# Patient Record
Sex: Male | Born: 2000 | Race: Black or African American | Hispanic: No | Marital: Single | Smoking: Never smoker
Health system: Southern US, Community
[De-identification: ages and names within clinical notes are randomized; demographics above are authoritative.]

## PROBLEM LIST (undated history)

## (undated) DIAGNOSIS — A048 Other specified bacterial intestinal infections: Secondary | ICD-10-CM

## (undated) DIAGNOSIS — S83209A Unspecified tear of unspecified meniscus, current injury, unspecified knee, initial encounter: Secondary | ICD-10-CM

---
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
## 2020-05-11 ENCOUNTER — Emergency Department (HOSPITAL_COMMUNITY)
Admission: EM | Admit: 2020-05-11 | Discharge: 2020-05-11 | Disposition: A | Discharge status: Home / Self Care | Payer: Medicaid Other | Attending: Emergency Medicine | Admitting: Emergency Medicine

## 2020-05-11 ENCOUNTER — Other Ambulatory Visit: Payer: Self-pay

## 2020-05-11 ENCOUNTER — Encounter (HOSPITAL_COMMUNITY): Payer: Self-pay

## 2020-05-11 ENCOUNTER — Emergency Department (HOSPITAL_COMMUNITY): Payer: Medicaid Other

## 2020-05-11 DIAGNOSIS — M25561 Pain in right knee: Secondary | ICD-10-CM

## 2020-05-11 MED ORDER — IBUPROFEN 400 MG PO TABS: 400.0000 mg | ORAL_TABLET | Freq: Four times a day (QID) | ORAL | 0 refills | Status: DC | PRN

## 2020-05-11 MED ORDER — IBUPROFEN 200 MG PO TABS
600.0000 mg | ORAL_TABLET | Freq: Once | ORAL | Status: AC
  Administered 2020-05-11: 600 mg via ORAL
  Filled 2020-05-11: qty 3

## 2020-05-11 NOTE — ED Provider Notes (Signed)
Walter Lloyd Provider Note   CSN: [id number] Arrival date & time: 05/11/20  1205     History Chief Complaint  Patient presents with  . Knee Pain    Walter Lloyd is a 20 y.o. male.  HPI   Patient with no significant medical history presents with chief complaint of right knee pain and swelling.  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  He endorses that on Monday the pain got worse, and he noted some swelling, he denies any recent trauma to the area, denies increased activities, denies IV drug use, autoimmune diseases, never had this in the past.  He denies paresthesias or weakness in his lower extremities, states he has difficulty bearing weight on it as it causes pain to. He has been taking ibuprofen without relief.  Patient denies alleviating factors.  He denies penile or testicular pain or no discharge, denies rashes, other joint pain, throat pain.  Patient denies headaches, fevers, chills, shortness of breath, chest pain, abdominal pain, nausea, vomiting, diarrhea, worsening pedal edema.  History reviewed. No pertinent past medical history.  There are no problems to display for this patient.   History reviewed. No pertinent surgical history.     History reviewed. No pertinent family history.     Home Medications Prior to Admission medications   Medication Sig Start Date End Date Taking? Authorizing Provider  ibuprofen (ADVIL) 400 MG tablet Take 1 tablet (400 mg total) by mouth every 6 (six) hours as needed. 05/11/20  Yes Carroll Sage, PA-C    Allergies    Patient has no known allergies.  Review of Systems   Review of Systems  Constitutional: Negative for chills and fever.  HENT: Negative for congestion and sore throat.   Eyes: Negative for visual disturbance.  Respiratory: Negative for shortness of breath.    Cardiovascular: Negative for chest pain.  Gastrointestinal: Negative for abdominal pain, diarrhea, nausea and vomiting.  Genitourinary: Negative for decreased urine volume, difficulty urinating, enuresis, penile discharge, penile swelling and testicular pain.  Musculoskeletal: Negative for back pain.       Right knee pain and swelling.  Skin: Negative for rash.  Neurological: Negative for dizziness and headaches.  Hematological: Does not bruise/bleed easily.    Physical Exam Updated Vital Signs BP 135/63 (BP Location: Right Arm)   Pulse 93   Temp 98.4 F (36.9 C) (Oral)   Resp 18   SpO2 100%   Physical Exam Vitals and nursing note reviewed.  Constitutional:      General: He is not in acute distress.    Appearance: He is not ill-appearing.  HENT:     Head: Normocephalic and atraumatic.     Nose: No congestion.     Mouth/Throat:     Mouth: Mucous membranes are moist.     Pharynx: Oropharynx is clear. No oropharyngeal exudate.  Eyes:     Conjunctiva/sclera: Conjunctivae normal.     Pupils: Pupils are equal, round, and reactive to light.  Cardiovascular:     Rate and Rhythm: Normal rate and regular rhythm.     Pulses: Normal pulses.     Heart sounds: No murmur heard. No friction rub. No gallop.   Pulmonary:     Effort: No respiratory distress.     Breath sounds: No wheezing, rhonchi or rales.  Musculoskeletal:  General: Swelling and tenderness present. No signs of injury.     Right lower leg: No edema.     Left lower leg: No edema.     Comments: Patient's right knee is nonerythematous, edema present, it was tender to palpation on the medial aspect of his tibial plateau, there is no palpable deformities or crepitus present.  He had full range of motion his knee, ankle, toes.  Neurovascularly intact.  He had noted laxity with medial force with worsening pain, no laxity with posterior anterior forces.  Skin:    General: Skin is warm and dry.     Comments: Limited  skin exam was performed, there is no laceration, drainage, ecchymosis, petechia track marks noted on patient's upper or lower extremities.  Neurological:     Mental Status: He is alert.  Psychiatric:        Mood and Affect: Mood normal.     ED Results / Procedures / Treatments   Labs (all labs ordered are listed, but only abnormal results are displayed) Labs Reviewed - No data to display  EKG None  Radiology DG Knee Complete 4 Views Right  Result Date: 05/11/2020 CLINICAL DATA:  Chronic right knee pain.  No recent injury. EXAM: RIGHT KNEE - COMPLETE 4+ VIEW COMPARISON:  None. FINDINGS: No evidence of fracture, dislocation, or joint effusion. No evidence of arthropathy or other focal bone abnormality. Soft tissues are unremarkable. IMPRESSION: Negative exam. Electronically Signed   By: Drusilla Kanner M.D.   On: 05/11/2020 13:18    Procedures Procedures   Medications Ordered in ED Medications  ibuprofen (ADVIL) tablet 600 mg (600 mg Oral Given 05/11/20 1319)    ED Course  I have reviewed the triage vital signs and the nursing notes.  Pertinent labs & imaging results that were available during my care of the patient were reviewed by me and considered in my medical decision making (see chart for details).    MDM Rules/Calculators/A&P                          Initial impression-patient presents with right knee pain and swelling.  He is alert, does not appear in acute distress, vital signs reassuring.  Will obtain x-ray for further evaluation.  Suspect may have a ligament or meniscus damage.  Work-up-x-ray of knee is unremarkable.   Rule out- I have low suspicion for septic arthritis as patient denies IV drug use, skin exam was performed no erythematous, edematous, warm joints noted on exam, no new heart murmur heard on exam.  Low suspicion for fracture or dislocation as x-ray does not feel any significant findings. low suspicion for ligament or tendon damage as area was  palpated no gross defects noted, they had full range of motion as well as 5/5 strength.  Low suspicion for compartment syndrome as area was palpated it was soft to the touch, neurovascular fully intact.   Plan-I suspect patient suffering from possible ligament or meniscus injury.  Will provide patient with a brace and crutches.  Will recommend he follows up with Ortho for further evaluation.  Vital signs have remained stable, no indication for hospital admission.  Patient discussed with attending and they agreed with assessment and plan.  Patient given at home care as well strict return precautions.  Patient verbalized that they understood agreed to said plan.   Final Clinical Impression(s) / ED Diagnoses Final diagnoses:  Acute pain of right knee    Rx / DC  Orders ED Discharge Orders         Ordered    ibuprofen (ADVIL) 400 MG tablet  Every 6 hours PRN        05/11/20 1402           Carroll Sage, PA-C 05/11/20 1407    Charlynne Pander, MD 05/11/20 2218

## 2020-05-11 NOTE — Progress Notes (Signed)
Orthopedic Tech Progress Note Patient Details:  Walter Lloyd [facility] 01/14/2001 [id number]  Ortho Devices Ortho Device/Splint Location: knee sleeve to RLE and crutches Ortho Device/Splint Interventions: Ordered,Application,Adjustment   Post Interventions Patient Tolerated: Well Instructions Provided: Care of device   Jennye Moccasin 05/11/2020, 2:08 PM

## 2020-05-11 NOTE — Discharge Instructions (Addendum)
You have been seen here for right knee pain. I recommend taking over-the-counter pain medications like ibuprofen and/or Tylenol every 6 hours as needed.  Please follow dosage on the back of bottle.  I also recommend applying heat to the area and keeping it elevated while at rest will decrease inflammation.  I given you a brace please wear during the day you may take off at nighttime.  Want you to follow-up with orthopedics for further evaluation.   Come back to the emergency department if you develop chest pain, shortness of breath, severe abdominal pain, uncontrolled nausea, vomiting, diarrhea.

## 2020-05-11 NOTE — ED Triage Notes (Addendum)
Pt reports right knee pain since Monday, denies any injury.

## 2020-05-15 ENCOUNTER — Other Ambulatory Visit: Payer: Self-pay

## 2020-05-15 ENCOUNTER — Ambulatory Visit (INDEPENDENT_AMBULATORY_CARE_PROVIDER_SITE_OTHER): Payer: Medicaid Other | Admitting: Orthopaedic Surgery

## 2020-05-15 DIAGNOSIS — M659 Synovitis and tenosynovitis, unspecified: Secondary | ICD-10-CM | POA: Diagnosis not present

## 2020-05-15 MED ORDER — IBUPROFEN 800 MG PO TABS: ORAL_TABLET | ORAL | 0 refills | Status: DC

## 2020-05-15 MED ORDER — LIDOCAINE HCL 1 % IJ SOLN
0.5000 mL | INTRAMUSCULAR | Status: AC | PRN
  Administered 2020-05-15: .5 mL

## 2020-05-15 NOTE — Progress Notes (Signed)
Office Visit Note   Patient: Walter Lloyd           Date of Birth: 2000-06-10           MRN: [id number] Visit Date: 05/15/2020              Requested by: No referring provider defined for this encounter. PCP: Patient, No Pcp Per   Assessment & Plan: Visit Diagnoses:  1. Synovitis of right knee     Plan: 70 cc aspirated.  Fluid sent for Gram stain, cell count, crystals, cultures..  Recheck 1week.  Discussed with him that there is a slight possibility there could be infection present.  Patient has noted swelling going down with the ibuprofen which was 400 mg every 6 hours.  He is almost out of the ibuprofen will place him on 800 mg p.o. twice daily with food.  Follow-Up Instructions: No follow-ups on file.   Orders:  Orders Placed This Encounter  Procedures  . Large Joint Inj   Meds ordered this encounter  Medications  . ibuprofen (ADVIL) 800 MG tablet    Sig: Take one tablet twice daily with food as needed.    Dispense:  40 tablet    Refill:  0      Procedures: Large Joint Inj: R knee on 05/15/2020 9:41 AM Indications: pain and joint swelling Details: 22 G 1.5 in needle, anterolateral approach  Arthrogram: No  Medications: 0.5 mL lidocaine 1 % Aspirate: 70 mL yellow and cloudy Outcome: tolerated well, no immediate complications Procedure, treatment alternatives, risks and benefits explained, specific risks discussed. Consent was given by the patient. Immediately prior to procedure a time out was called to verify the correct patient, procedure, equipment, support staff and site/side marked as required. Patient was prepped and draped in the usual sterile fashion.       Clinical Data: No additional findings.   Subjective: Chief Complaint  Patient presents with  . Right Knee - Pain    HPI 20 year old male was doing squats with heavy weights on his shoulder and afterwards developed significant swelling in his knee with knee effusion pain difficulty  walking.  No fever chills no past history of knee problems other than occasionally after working out with weights he did notice some mild medial joint line pain.  Denies locking no history of ligamentous injury.  He is likes to run and also played some basketball in the past.  He is to ice his knee down along the medial aspect but states he did not ever have any problems with swelling until this acute episode.  He was seen in emergency room x-rays were negative for fracture.  Patient is a Consulting civil engineer at [employer], denies history of drug abuse.  Review of Systems all other systems are negative to HPI.   Objective: Vital Signs: BP (!) 135/58   Pulse 88   Ht 6\' 3"  (1.905 m)   Wt 187 lb (84.8 kg)   BMI 23.37 kg/m   Physical Exam Constitutional:      Appearance: He is well-developed and well-nourished.  HENT:     Head: Normocephalic and atraumatic.  Eyes:     Extraocular Movements: EOM normal.     Pupils: Pupils are equal, round, and reactive to light.  Neck:     Thyroid: No thyromegaly.     Trachea: No tracheal deviation.  Cardiovascular:     Rate and Rhythm: Normal rate.  Pulmonary:     Effort: Pulmonary effort is normal.  Breath sounds: No wheezing.  Abdominal:     General: Bowel sounds are normal.     Palpations: Abdomen is soft.  Skin:    General: Skin is warm and dry.     Capillary Refill: Capillary refill takes less than 2 seconds.  Neurological:     Mental Status: He is alert and oriented to person, place, and time.  Psychiatric:        Mood and Affect: Mood and affect normal.        Behavior: Behavior normal.        Thought Content: Thought content normal.        Judgment: Judgment normal.     Ortho Exam negative Lachman medial lateral collateral ligaments are stable.  Patient has a 3-4+ knee effusion.  No increased warmth.  He can flex to 90.  He does reach full extension.  He does have some medial joint line tenderness and tenderness over the medial plica.  Opposite left  knee shows minimal crepitus with knee extension.  Normal patellar tracking right left knee distal pulses are normal negative logroll to the hips.  Specialty Comments:  No specialty comments available.  Imaging: No results found.   PMFS History: Patient Active Problem List   Diagnosis Date Noted  . Synovitis of right knee 05/15/2020   No past medical history on file.  No family history on file.  No past surgical history on file. Social History   Occupational History  . Not on file  Tobacco Use  . Smoking status: Not on file  . Smokeless tobacco: Not on file  Substance and Sexual Activity  . Alcohol use: Not on file  . Drug use: Not on file  . Sexual activity: Not on file

## 2020-05-15 NOTE — Addendum Note (Signed)
Addended by: Rogers Seeds on: 05/15/2020 10:11 AM   Modules accepted: Orders

## 2020-05-21 LAB — ANAEROBIC AND AEROBIC CULTURE
AER RESULT:: NO GROWTH
MICRO NUMBER:: 11558572
MICRO NUMBER:: 11558573
SPECIMEN QUALITY:: ADEQUATE
SPECIMEN QUALITY:: ADEQUATE

## 2020-05-21 LAB — SYNOVIAL FLUID ANALYSIS, COMPLETE
Basophils, %: 0 %
Eosinophils-Synovial: 0 % (ref 0–2)
Lymphocytes-Synovial Fld: 39 % (ref 0–74)
Monocyte/Macrophage: 0 % (ref 0–69)
Neutrophil, Synovial: 59 % — ABNORMAL HIGH (ref 0–24)
Synoviocytes, %: 2 % (ref 0–15)
WBC, Synovial: 9185 cells/uL — ABNORMAL HIGH (ref <150)

## 2020-05-23 ENCOUNTER — Other Ambulatory Visit: Payer: Self-pay

## 2020-05-23 ENCOUNTER — Ambulatory Visit (INDEPENDENT_AMBULATORY_CARE_PROVIDER_SITE_OTHER): Payer: Medicaid Other | Admitting: Orthopaedic Surgery

## 2020-05-23 VITALS — BP 131/80 | HR 83 | Ht 75.0 in | Wt 187.0 lb

## 2020-05-23 DIAGNOSIS — M659 Synovitis and tenosynovitis, unspecified: Secondary | ICD-10-CM

## 2020-05-23 NOTE — Progress Notes (Signed)
Office Visit Note   Patient: Walter Lloyd           Date of Birth: [birth year]           MRN: [id number] Visit Date: 05/23/2020              Requested by: No referring provider defined for this encounter. PCP: Patient, No Pcp Per   Assessment & Plan: Visit Diagnoses:  1. Synovitis of right knee     Plan: Patient's had persistence of effusion of his knee fluid was tapped off no evidence of crystals no infection.  He has persistent pain and swelling difficulty walking pain with bending is had some catching but no true locking.  His pain is primarily medial and he may have a cartilage flap tear or medial meniscal tear which is persistently symptomatic despite anti-inflammatories intermittent ice, knee aspiration done 2/21 with 70cc yellow cloudy fluid aspirated.  Patient needs MRI scan office follow-up after scan for review.  Follow-Up Instructions: No follow-ups on file.   Orders:  Orders Placed This Encounter  Procedures  . MR Knee Right w/o contrast   No orders of the defined types were placed in this encounter.     Procedures: No procedures performed   Clinical Data: No additional findings.   Subjective: Chief Complaint  Patient presents with  . Right Knee - Follow-up    HPI Walter Lloyd is a 20 year old male with right knee pain that occurred few days after working out.  Fluid was removed negative for culture slightly cloudy no crystals.  Patient's been better still has some mild swelling.  70 cc was originally aspirated.  He is used ibuprofen.  Pain is primarily medial occasionally has had some catching no definite locking.  He does not recall any specific twisting injury but he was doing at one point squats with weights and other lower extremity jumping type strengthening activities along with upper extremity work.  He is to be active did a lot of running.  Review of Systems 14 point system negative no history of rheumatologic conditions.   Objective: Vital  Signs: BP 131/80   Pulse 83   Ht 6\' 3"  (1.905 m)   Wt 187 lb (84.8 kg)   BMI 23.37 kg/m   Physical Exam Constitutional:      Appearance: He is well-developed and well-nourished.  HENT:     Head: Normocephalic and atraumatic.  Eyes:     Extraocular Movements: EOM normal.     Pupils: Pupils are equal, round, and reactive to light.  Neck:     Thyroid: No thyromegaly.     Trachea: No tracheal deviation.  Cardiovascular:     Rate and Rhythm: Normal rate.  Pulmonary:     Effort: Pulmonary effort is normal.     Breath sounds: No wheezing.  Abdominal:     General: Bowel sounds are normal.     Palpations: Abdomen is soft.  Skin:    General: Skin is warm and dry.     Capillary Refill: Capillary refill takes less than 2 seconds.  Neurological:     Mental Status: He is alert and oriented to person, place, and time.  Psychiatric:        Mood and Affect: Mood and affect normal.        Behavior: Behavior normal.        Thought Content: Thought content normal.        Judgment: Judgment normal.     Ortho Exam 2+ knee  effusion no patellofemoral degenerative changes no subluxation.  Some tenderness along the medial joint line no palpable Baker's cyst posteriorly but some tenderness posterior medially.  Lateral joint lines nontender ACL PCL is normal.  Some posterior medial discomfort with hyperextension of the knee hip range of motion is normal reflexes are normal no sciatic notch tenderness.  No swelling or knee effusion of his opposite left knee  Specialty Comments:  No specialty comments available.  Imaging: No results found.   PMFS History: Patient Active Problem List   Diagnosis Date Noted  . Synovitis of right knee 05/15/2020   No past medical history on file.  No family history on file.  No past surgical history on file. Social History   Occupational History  . Not on file  Tobacco Use  . Smoking status: Not on file  . Smokeless tobacco: Not on file  Substance and  Sexual Activity  . Alcohol use: Not on file  . Drug use: Not on file  . Sexual activity: Not on file

## 2020-06-08 ENCOUNTER — Ambulatory Visit
Admission: RE | Admit: 2020-06-08 | Discharge: 2020-06-08 | Disposition: A | Discharge status: Home / Self Care | Payer: Medicaid Other | Source: Ambulatory Visit | Attending: Orthopaedic Surgery | Admitting: Orthopaedic Surgery

## 2020-06-08 ENCOUNTER — Other Ambulatory Visit: Payer: Self-pay

## 2020-06-08 DIAGNOSIS — M659 Synovitis and tenosynovitis, unspecified: Secondary | ICD-10-CM

## 2020-06-16 ENCOUNTER — Other Ambulatory Visit: Payer: Self-pay | Admitting: Orthopaedic Surgery

## 2020-06-30 ENCOUNTER — Ambulatory Visit (INDEPENDENT_AMBULATORY_CARE_PROVIDER_SITE_OTHER): Payer: Medicaid Other | Admitting: Orthopaedic Surgery

## 2020-06-30 VITALS — BP 142/77 | HR 71 | Ht 75.0 in | Wt 187.0 lb

## 2020-06-30 DIAGNOSIS — M659 Synovitis and tenosynovitis, unspecified: Secondary | ICD-10-CM | POA: Diagnosis not present

## 2020-06-30 NOTE — Progress Notes (Signed)
Office Visit Note   Patient: Walter Lloyd           Date of Birth: 03-05-2001           MRN: [id number] Visit Date: 06/30/2020              Requested by: No referring provider defined for this encounter. PCP: Patient, No Pcp Per (Inactive)   Assessment & Plan: Visit Diagnoses:  1. Synovitis of right knee     Plan: MRI images are reviewed pathophysiology discussed.  He will continue the ibuprofen and can gradually increase his activity as his symptoms improved.  We discussed at this point no knee arthroscopy needs to be done emergently.  MRI scan was reviewed with patient.  He is happy is made good improvement.  Although there is mild effusion today I do not think he needs repeat aspiration.  Follow-Up Instructions: Return in about 1 month (around 07/30/2020), or if symptoms worsen or fail to improve.   Orders:  No orders of the defined types were placed in this encounter.  No orders of the defined types were placed in this encounter.     Procedures: No procedures performed   Clinical Data: No additional findings.   Subjective: Chief Complaint  Patient presents with  . Right Knee - Pain, Follow-up    MRI review    HPI 20 year old male returns for follow-up of right knee problems with synovitis aspirated 70 cc yellow cloudy fluid negative for cultures and crystals.  He states as long as he takes ibuprofen it does not bother him and swelling is gradually going down.  When he wears a knee brace or sleeve tight on his knees noticed he gets some swelling in his ankle.  He denies fever chills no other joint symptoms.  Review of Systems unchanged from 05/23/2020 office visit.   Objective: Vital Signs: BP (!) 142/77   Pulse 71   Ht 6\' 3"  (1.905 m)   Wt 187 lb (84.8 kg)   BMI 23.37 kg/m   Physical Exam Constitutional:      Appearance: He is well-developed.  HENT:     Head: Normocephalic and atraumatic.  Eyes:     Pupils: Pupils are equal, round, and reactive to  light.  Neck:     Thyroid: No thyromegaly.     Trachea: No tracheal deviation.  Cardiovascular:     Rate and Rhythm: Normal rate.  Pulmonary:     Effort: Pulmonary effort is normal.     Breath sounds: No wheezing.  Abdominal:     General: Bowel sounds are normal.     Palpations: Abdomen is soft.  Skin:    General: Skin is warm and dry.     Capillary Refill: Capillary refill takes less than 2 seconds.  Neurological:     Mental Status: He is alert and oriented to person, place, and time.  Psychiatric:        Behavior: Behavior normal.        Thought Content: Thought content normal.        Judgment: Judgment normal.     Ortho Exam mild knee swelling good patellar tracking.  Trace joint line tenderness  Specialty Comments:  No specialty comments available.  Imaging: CLINICAL DATA:  Right knee pain. Sports injury February 2022.  EXAM: MRI OF THE RIGHT KNEE WITHOUT CONTRAST  TECHNIQUE: Multiplanar, multisequence MR imaging of the knee was performed. No intravenous contrast was administered.  COMPARISON:  None.  FINDINGS: MENISCI  Medial: Intact.  Lateral: Attenuation of the root of the posterior horn of the lateral meniscus concerning for a radial tear.  LIGAMENTS  Cruciates: ACL and PCL are intact.  Collaterals: Medial collateral ligament is intact. Iliotibial band and biceps femoris tendons are intact. Femoral insertion of the fibular collateral ligament is severely attenuated concerning for a ligament strain and a partial-thickness tear.  CARTILAGE  Patellofemoral:  No chondral defect.  Medial:  No chondral defect.  Lateral:  No chondral defect.  JOINT: Large joint effusion. Normal Hoffa's fat-pad. No plical thickening.  POPLITEAL FOSSA: Increased signal within the popliteus tendon at the femoral insertion concerning for severe tendinosis. Small Baker's cyst.  EXTENSOR MECHANISM: Intact quadriceps tendon. Intact patellar tendon.  Intact lateral patellar retinaculum. Intact medial patellar retinaculum. Intact MPFL.  BONES: No aggressive osseous lesion. No fracture or dislocation.  Other: No fluid collection or hematoma. Muscles are normal.  IMPRESSION: 1. Attenuation of the root of the posterior horn of the lateral meniscus concerning for a radial tear. 2. Increased signal within the popliteus tendon at the femoral insertion concerning for severe tendinosis. 3. Large joint effusion. 4. Femoral insertion of the fibular collateral ligament is severely attenuated concerning for a ligament strain and a partial-thickness tear.   Electronically Signed   By: Elige Ko   On: 06/09/2020 10:13   PMFS History: Patient Active Problem List   Diagnosis Date Noted  . Synovitis of right knee 05/15/2020   No past medical history on file.  No family history on file.  No past surgical history on file. Social History   Occupational History  . Not on file  Tobacco Use  . Smoking status: Not on file  . Smokeless tobacco: Not on file  Substance and Sexual Activity  . Alcohol use: Not on file  . Drug use: Not on file  . Sexual activity: Not on file

## 2020-07-16 ENCOUNTER — Other Ambulatory Visit: Payer: Self-pay | Admitting: Orthopaedic Surgery

## 2020-07-17 NOTE — Telephone Encounter (Signed)
Please advise 

## 2020-07-25 ENCOUNTER — Ambulatory Visit: Payer: Medicaid Other | Admitting: Orthopaedic Surgery

## 2021-05-12 ENCOUNTER — Other Ambulatory Visit: Payer: Self-pay

## 2021-05-12 ENCOUNTER — Encounter (HOSPITAL_COMMUNITY): Payer: Self-pay | Admitting: Emergency Medicine

## 2021-05-12 ENCOUNTER — Emergency Department (HOSPITAL_COMMUNITY)
Admission: EM | Admit: 2021-05-12 | Discharge: 2021-05-12 | Disposition: A | Discharge status: Home / Self Care | Payer: Medicaid Other | Attending: Emergency Medicine | Admitting: Emergency Medicine

## 2021-05-12 DIAGNOSIS — J039 Acute tonsillitis, unspecified: Secondary | ICD-10-CM

## 2021-05-12 DIAGNOSIS — Z20822 Contact with and (suspected) exposure to covid-19: Secondary | ICD-10-CM | POA: Diagnosis not present

## 2021-05-12 DIAGNOSIS — J029 Acute pharyngitis, unspecified: Secondary | ICD-10-CM | POA: Insufficient documentation

## 2021-05-12 LAB — GROUP A STREP BY PCR: Group A Strep by PCR: NOT DETECTED

## 2021-05-12 LAB — RESP PANEL BY RT-PCR (FLU A&B, COVID) ARPGX2
Influenza A by PCR: NEGATIVE
Influenza B by PCR: NEGATIVE
SARS Coronavirus 2 by RT PCR: NEGATIVE

## 2021-05-12 MED ORDER — DEXAMETHASONE SODIUM PHOSPHATE 10 MG/ML IJ SOLN
10.0000 mg | Freq: Once | INTRAMUSCULAR | Status: AC
  Administered 2021-05-12: 10 mg via INTRAMUSCULAR
  Filled 2021-05-12: qty 1

## 2021-05-12 NOTE — ED Triage Notes (Signed)
Pt reports sore throat and fever since Wednesday.

## 2021-05-12 NOTE — Discharge Instructions (Addendum)
You do not have strep, COVID or flu.  I suspect you have a viral tonsillitis, you were treated in the ED with a shot of steroids that should help reduce inflammation over the next 3 days.  Continue taking the naproxen and Tylenol.  Return to school on Monday if you are fever free and feeling improved, if not I have provided you a school note until Tuesday.  Return to ED for reevaluation if things worsen

## 2021-05-12 NOTE — ED Provider Triage Note (Signed)
Emergency Medicine Provider Triage Evaluation Note  Walter Lloyd , a 21 y.o. male  was evaluated in triage.  Pt complains of sore throat and neck pain x 4 days. Has tried nyquil without relief.   Review of Systems  Positive: Sore throat, neck pain Negative: Fevers, chills  Physical Exam  BP 129/78 (BP Location: Right Arm)    Pulse 83    Temp 98.2 F (36.8 C) (Oral)    Resp 18    SpO2 100%  Gen:   Awake, no distress   Resp:  Normal effort  MSK:   Moves extremities without difficulty  Other:  Normal passive ROM of neck  Medical Decision Making  Medically screening exam initiated at 2:55 PM.  Appropriate orders placed.  DRUE HARR was informed that the remainder of the evaluation will be completed by another provider, this initial triage assessment does not replace that evaluation, and the importance of remaining in the ED until their evaluation is complete.     Meshawn Oconnor T, PA-C 05/12/21 1504

## 2021-05-12 NOTE — ED Provider Notes (Signed)
[facility] EMERGENCY DEPARTMENT Provider Note   CSN: [id number] Arrival date & time: 05/12/21  1445     History  No chief complaint on file.   Walter Lloyd is a 21 y.o. male.  HPI  Patient presents with sore throat x4 days.  Associated with a headache and body aches and fevers at home.  He has been trying Tylenol and NyQuil, takes naproxen twice daily for knee pain that has not helped with the sore throat.  Denies any issues swallowing, no vomiting or abdominal pain.  No voice changes, no difficulty swallowing.  Home Medications Prior to Admission medications   Medication Sig Start Date End Date Taking? Authorizing Provider  ibuprofen (ADVIL) 800 MG tablet TAKE ONE TABLET TWICE DAILY WITH FOOD AS NEEDED. 07/17/20   Eldred Manges, MD      Allergies    Patient has no known allergies.    Review of Systems   Review of Systems  HENT:  Positive for sore throat.    Physical Exam Updated Vital Signs BP 129/78 (BP Location: Right Arm)    Pulse 83    Temp 98.2 F (36.8 C) (Oral)    Resp 18    SpO2 100%  Physical Exam Vitals and nursing note reviewed. Exam conducted with a chaperone present.  Constitutional:      General: He is not in acute distress.    Appearance: Normal appearance.  HENT:     Head: Normocephalic and atraumatic.     Mouth/Throat:     Mouth: Mucous membranes are moist.     Tongue: No lesions. Tongue does not deviate from midline.     Pharynx: Uvula midline. No oropharyngeal exudate or posterior oropharyngeal erythema.     Tonsils: No tonsillar exudate or tonsillar abscesses. 3+ on the right. 3+ on the left.  Eyes:     General: No scleral icterus.    Extraocular Movements: Extraocular movements intact.     Pupils: Pupils are equal, round, and reactive to light.  Cardiovascular:     Rate and Rhythm: Normal rate and regular rhythm.  Pulmonary:     Effort: Pulmonary effort is normal.     Breath sounds: Normal breath sounds.  Abdominal:      General: Abdomen is flat.     Tenderness: There is no abdominal tenderness.  Skin:    Coloration: Skin is not jaundiced.  Neurological:     Mental Status: He is alert. Mental status is at baseline.     Coordination: Coordination normal.    ED Results / Procedures / Treatments   Labs (all labs ordered are listed, but only abnormal results are displayed) Labs Reviewed  RESP PANEL BY RT-PCR (FLU A&B, COVID) ARPGX2  GROUP A STREP BY PCR    EKG None  Radiology No results found.  Procedures Procedures    Medications Ordered in ED Medications  dexamethasone (DECADRON) injection 10 mg (has no administration in time range)    ED Course/ Medical Decision Making/ A&P                           Medical Decision Making Risk Prescription drug management.   This patient presents to the ED for concern of sore throat, this involves an extensive number of treatment options, and is a complaint that carries with it a high risk of complications and morbidity.     The differential diagnosis includes viral vs. strep pharyngitis, PTA/retropharyngeal abscess,  Ludewig angina, sepsis other   Patient vital signs are normal without any fever, tachycardia, tachypnea.  Not septic appearing.  Lungs are clear to auscultation without stridor. Uvula is midline and there is no trismus on exam.  There is some tonsillar edema but no exudate.  Additionally, no submandibular swelling and sublingual space is soft.   I ordered IM Decadron for the sore throat and suspect tonsillitis.  Given exam and workup, I doubt PTA, retropharyngeal abscess, Ludwig angina or other emergent etiology for his pharyngitis.  Strep and COVID/flu PCR were ordered in triage, I independently viewed the results he is negative.    Given negative for strep pharyngitis and short duration of symptoms, suspect viral etiology and do not think antibiotics are indicated.    On reevaluation patient reports feeling improved. I  considered admission for period of observation, but  given he is overall well-appearing and tolerating p.o. I do not feel admission would be necessary.  Patient is stable for discharge.           Final Clinical Impression(s) / ED Diagnoses Final diagnoses:  None    Rx / DC Orders ED Discharge Orders     None         Theron Arista, Cordelia Poche 05/12/21 1744    Gloris Manchester, MD 05/12/21 2240

## 2022-03-04 IMAGING — MR MR KNEE*R* W/O CM
4 of 6 series · 22 of 40 positions shown · non-contrast
Comparison: None.

CLINICAL DATA: Right knee pain. Sports injury April 2020.

EXAM:
MRI OF THE RIGHT KNEE WITHOUT CONTRAST
TECHNIQUE: Multiplanar, multisequence MR imaging of the knee was performed. No
intravenous contrast was administered.

[Series 3: T2 fat-sat · axial · 4.0mm · 0.50mm/px · z∈[-69,+26]mm · 5 of 24 slices shown (1 of 2)]
[im 1/24]
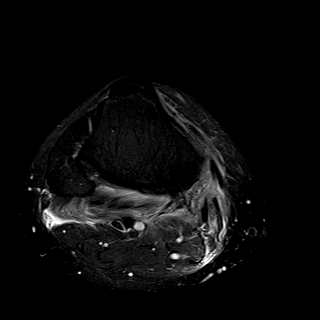
[im 4/24]
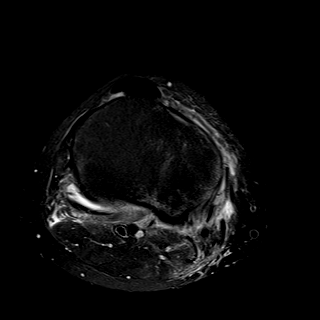
[im 8/24]
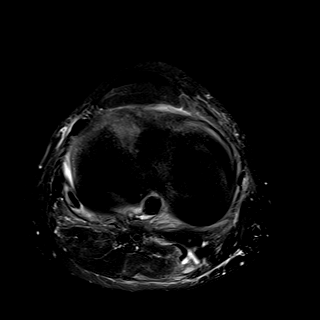
[im 12/24]
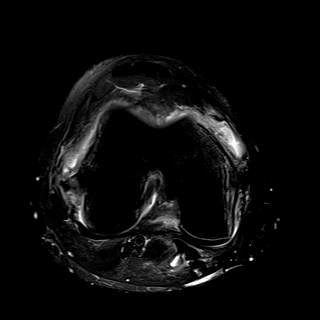
[im 20/24]
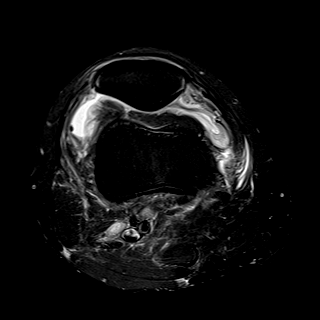

[Series 5: T2 fat-sat · coronal · 4.0mm · 0.29mm/px · 3 of 22 slices shown (2 of 2)]
[im 5/22]
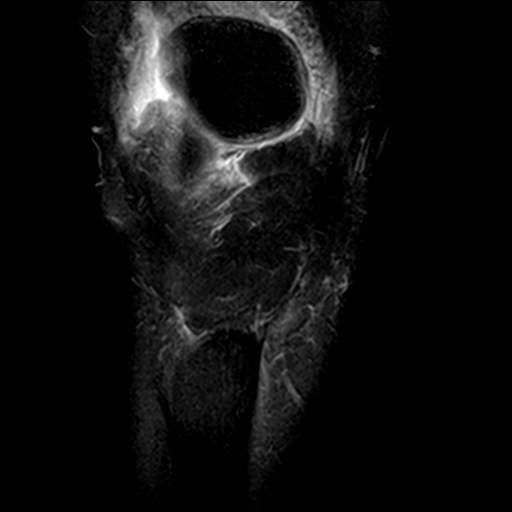
[im 13/22]
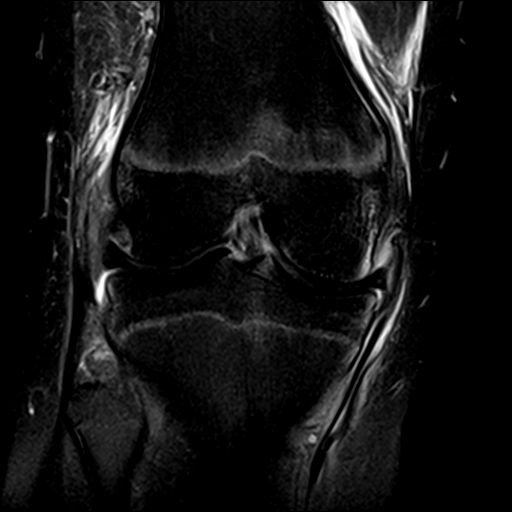
[im 22/22]
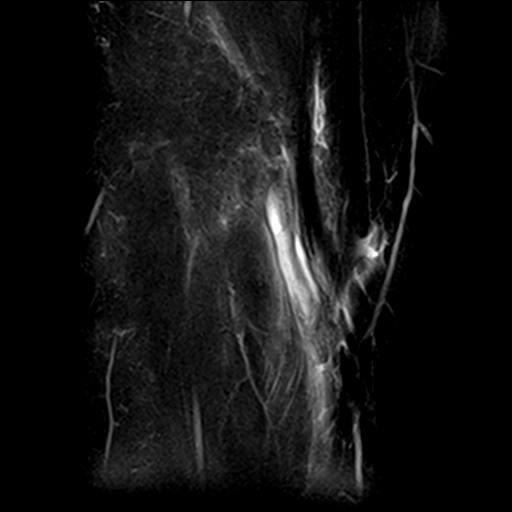

[Series 7: PD fat-sat · sagittal · 3.0mm · 0.29mm/px · 7 of 27 slices shown (1 of 2)]
[im 1/27]
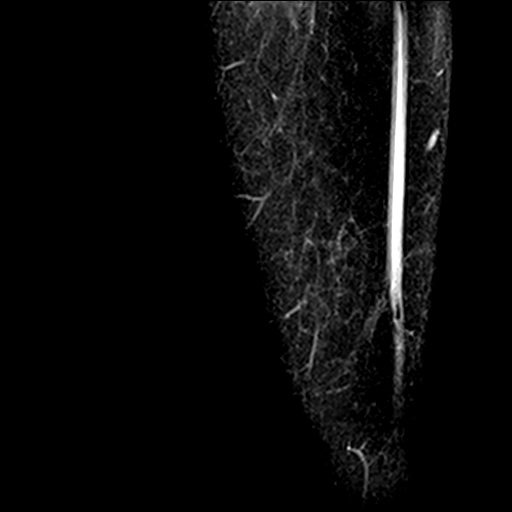
[im 5/27]
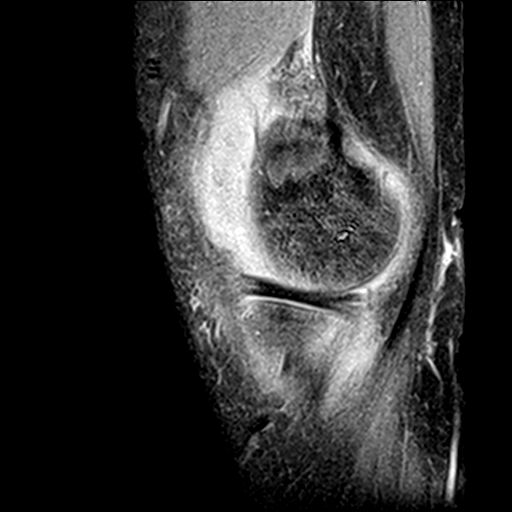
[im 9/27]
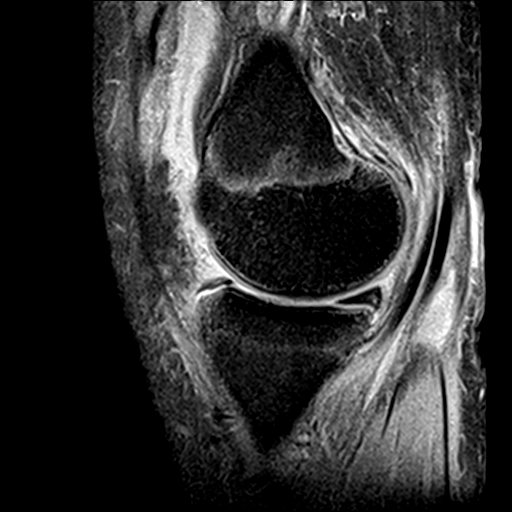
[im 14/27]
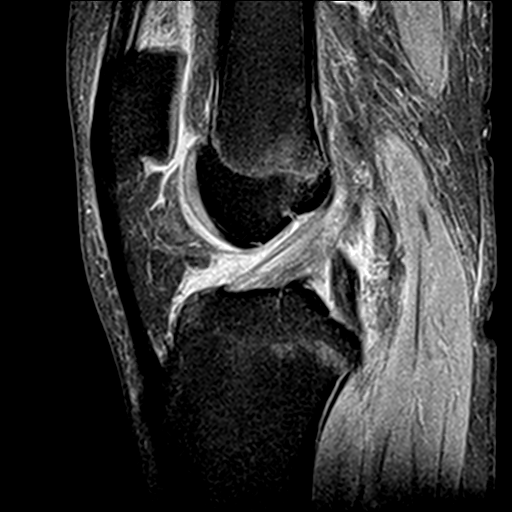
[im 18/27]
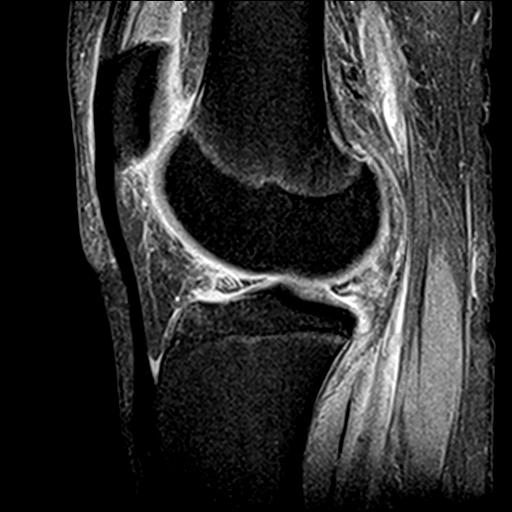
[im 22/27]
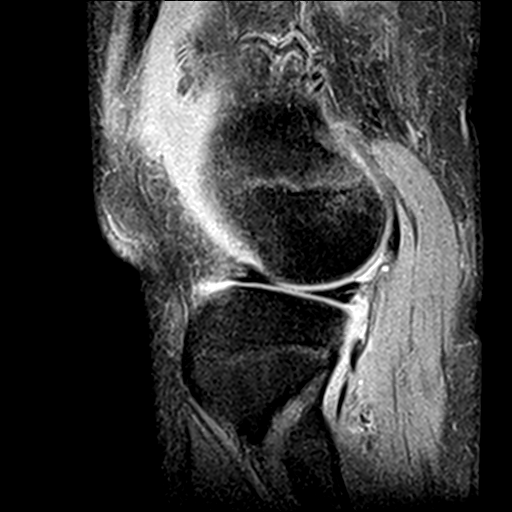
[im 27/27]
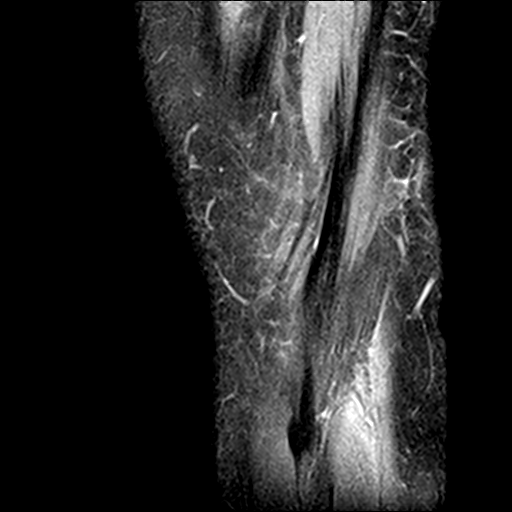

[Series 8: PD fat-sat · coronal · 3.0mm · 0.29mm/px · 7 of 28 slices shown (2 of 2)]
[im 1/28]
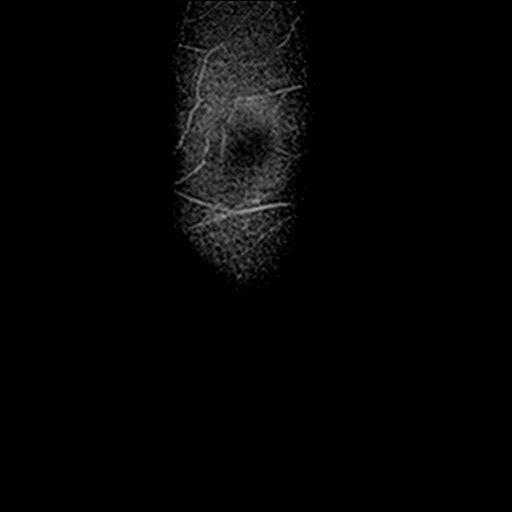
[im 5/28]
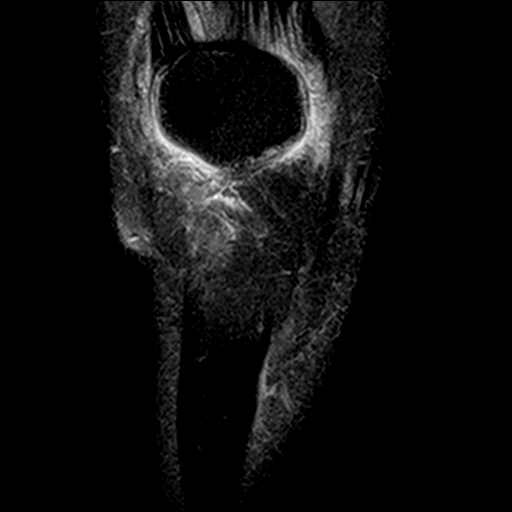
[im 10/28]
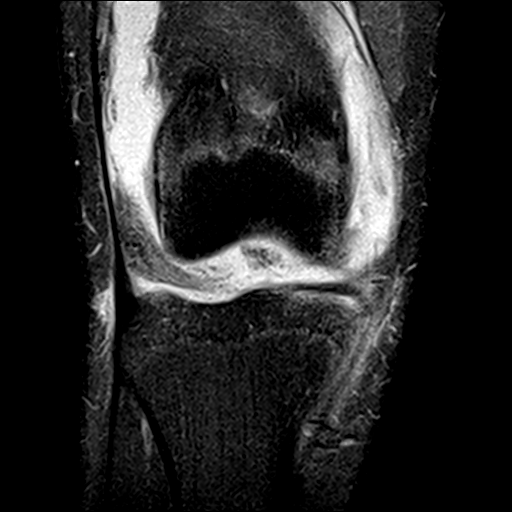
[im 14/28]
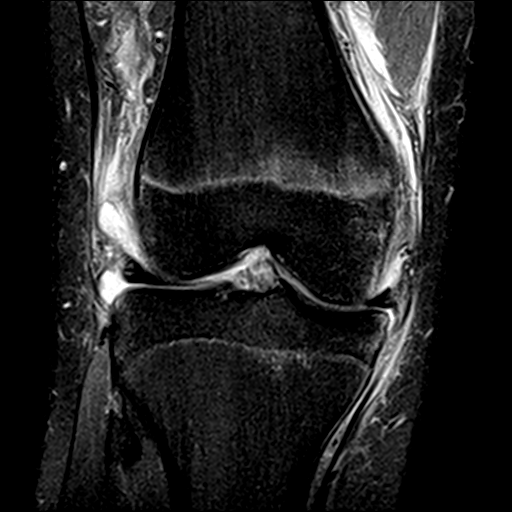
[im 19/28]
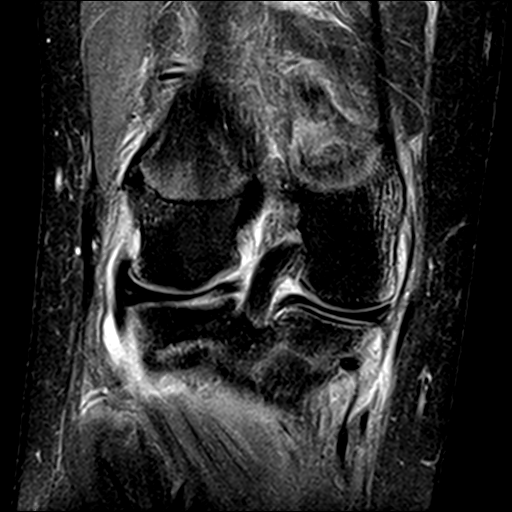
[im 23/28]
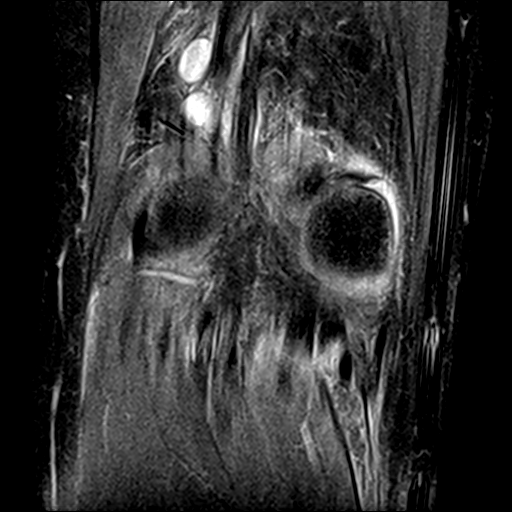
[im 28/28]
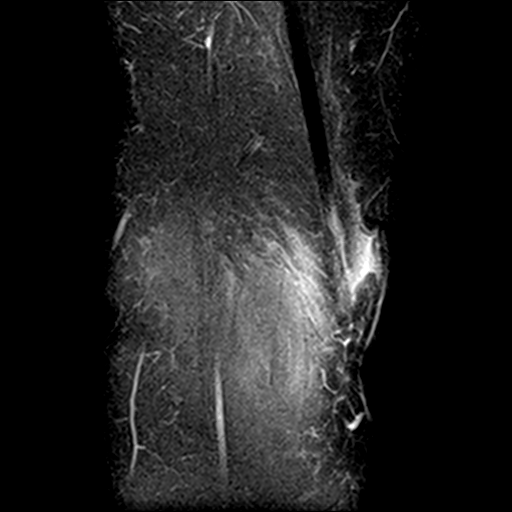

[22 of 40 positions shown; findings below may reference images not displayed]

FINDINGS: MENISCI

Medial: Intact.

Lateral: Attenuation of the root of the posterior horn of the
lateral meniscus concerning for a radial tear.

LIGAMENTS

Cruciates: ACL and PCL are intact.

Collaterals: Medial collateral ligament is intact. Iliotibial band
and biceps femoris tendons are intact. Femoral insertion of the
fibular collateral ligament is severely attenuated concerning for a
ligament strain and a partial-thickness tear.

CARTILAGE

Patellofemoral:  No chondral defect.

Medial:  No chondral defect.

Lateral:  No chondral defect.

JOINT: Large joint effusion. Normal Laru Rachid. No plical
thickening.

POPLITEAL FOSSA: Increased signal within the popliteus tendon at the
femoral insertion concerning for severe tendinosis. Small Baker's
cyst.

EXTENSOR MECHANISM: Intact quadriceps tendon. Intact patellar
tendon. Intact lateral patellar retinaculum. Intact medial patellar
retinaculum. Intact MPFL.

BONES: No aggressive osseous lesion. No fracture or dislocation.

Other: No fluid collection or hematoma. Muscles are normal.
IMPRESSION: 1. Attenuation of the root of the posterior horn of the lateral
meniscus concerning for a radial tear.
2. Increased signal within the popliteus tendon at the femoral
insertion concerning for severe tendinosis.
3. Large joint effusion.
4. Femoral insertion of the fibular collateral ligament is severely
attenuated concerning for a ligament strain and a partial-thickness
tear.

## 2022-06-19 ENCOUNTER — Encounter (HOSPITAL_COMMUNITY): Payer: Self-pay

## 2022-06-19 ENCOUNTER — Emergency Department (HOSPITAL_COMMUNITY)
Admission: EM | Admit: 2022-06-19 | Discharge: 2022-06-19 | Disposition: A | Discharge status: Home / Self Care | Payer: Medicaid Other | Attending: Emergency Medicine | Admitting: Emergency Medicine

## 2022-06-19 DIAGNOSIS — M25552 Pain in left hip: Secondary | ICD-10-CM

## 2022-06-19 HISTORY — DX: Other specified bacterial intestinal infections: A04.8

## 2022-06-19 HISTORY — DX: Unspecified tear of unspecified meniscus, current injury, unspecified knee, initial encounter: S83.209A

## 2022-06-19 MED ORDER — IBUPROFEN 800 MG PO TABS
800.0000 mg | ORAL_TABLET | Freq: Once | ORAL | Status: AC
  Administered 2022-06-19: 800 mg via ORAL
  Filled 2022-06-19: qty 1

## 2022-06-19 MED ORDER — IBUPROFEN 800 MG PO TABS: 800.0000 mg | ORAL_TABLET | Freq: Three times a day (TID) | ORAL | 0 refills | Status: AC

## 2022-06-19 NOTE — ED Triage Notes (Signed)
Pt reports left hip pain, worse with movement, no injuries or falls. Pt reports no pain with palpitation, denies radiation to LLE, hurts to ambulate, pt has full ROM to LLE. Denies pain to groin area, denies urinary symptoms. NAD noted, VSS.

## 2022-06-19 NOTE — ED Provider Notes (Signed)
[place] EMERGENCY DEPARTMENT AT [facility] Provider Note   CSN: [id number] Arrival date & time: 06/19/22  2139     History  Chief Complaint  Patient presents with   Hip Pain    Walter Lloyd is a 22 y.o. male.  With no significant past medical history who presents to the ED for evaluation of atraumatic left hip pain.  He states it began yesterday and has progressively gotten worse.  He noticed it when he woke up yesterday morning.  He denies falls or trauma.  States the pain is mostly present when he is walking.  He denies recent illnesses, numbness, weakness, tingling, low back pain, fevers, chills.  Pain does not radiate down the leg.  It is localized to the lateral aspect overlying the greater trochanter.   Hip Pain       Home Medications Prior to Admission medications   Medication Sig Start Date End Date Taking? Authorizing Provider  ibuprofen (ADVIL) 800 MG tablet Take 1 tablet (800 mg total) by mouth 3 (three) times daily. 06/19/22  Yes Brandyn Thien, Grafton Folk, PA-C      Allergies    Patient has no known allergies.    Review of Systems   Review of Systems  Musculoskeletal:  Positive for arthralgias.  All other systems reviewed and are negative.   Physical Exam Updated Vital Signs BP (!) 155/73 (BP Location: Left Arm)   Pulse 83   Temp 98.1 F (36.7 C) (Oral)   Resp 18   Ht 6\' 3"  (1.905 m)   Wt 95.3 kg   SpO2 100%   BMI 26.25 kg/m  Physical Exam Vitals and nursing note reviewed.  Constitutional:      General: He is not in acute distress.    Appearance: Normal appearance. He is normal weight. He is not ill-appearing.  HENT:     Head: Normocephalic and atraumatic.  Pulmonary:     Effort: Pulmonary effort is normal. No respiratory distress.  Abdominal:     General: Abdomen is flat.  Musculoskeletal:        General: Normal range of motion.     Cervical back: Neck supple.     Comments: No TTP to left hip.  Ambulated without difficulty.   Full active ROM including flexion and extension of the left hip.  Skin:    General: Skin is warm and dry.     Findings: No rash.  Neurological:     Mental Status: He is alert and oriented to person, place, and time.  Psychiatric:        Mood and Affect: Mood normal.        Behavior: Behavior normal.     ED Results / Procedures / Treatments   Labs (all labs ordered are listed, but only abnormal results are displayed) Labs Reviewed - No data to display  EKG None  Radiology No results found.  Procedures Procedures    Medications Ordered in ED Medications  ibuprofen (ADVIL) tablet 800 mg (800 mg Oral Given 06/19/22 2219)    ED Course/ Medical Decision Making/ A&P                             Medical Decision Making Risk Prescription drug management.  This patient presents to the ED for concern of left hip pain, this involves an extensive number of treatment options.  The differential diagnosis includes strain, sprain, contusion, bursitis  My initial workup includes  pain control  Additional history obtained from: Nursing notes from this visit.  Afebrile, hemodynamically stable.  22 year old male presenting to the ED for evaluation of atraumatic left hip pain.  His physical exam is reassuring and benign.  In the absence of trauma, and after shared decision-making conversation patient opted out of imaging.  He was given Motrin in the ED.  Pain likely secondary to muscle strain.  He was encouraged to follow-up with his primary care provider if his symptoms do not work.  He was educated on appropriate dosing of Tylenol and ibuprofen at home.  He was educated to perform gentle daily range of motion stretching exercises.  He was given return precautions.  Stable at discharge.  At this time there does not appear to be any evidence of an acute emergency medical condition and the patient appears stable for discharge with appropriate outpatient follow up. Diagnosis was discussed with  patient who verbalizes understanding of care plan and is agreeable to discharge. I have discussed return precautions with patient who verbalizes understanding. Patient encouraged to follow-up with their PCP within 1 week. All questions answered.  Note: Portions of this report may have been transcribed using voice recognition software. Every effort was made to ensure accuracy; however, inadvertent computerized transcription errors may still be present.        Final Clinical Impression(s) / ED Diagnoses Final diagnoses:  Left hip pain    Rx / DC Orders ED Discharge Orders          Ordered    ibuprofen (ADVIL) 800 MG tablet  3 times daily        06/19/22 2215              Nehemiah Massed 06/19/22 2307    Hayden Rasmussen, MD 06/20/22 1052

## 2022-06-19 NOTE — Discharge Instructions (Signed)
You have been seen today for your complaint of left hip pain. Your discharge medications include Alternate tylenol and ibuprofen for pain. You may alternate these every 4 hours. You may take up to 800 mg of ibuprofen at a time and up to 1000 mg of tylenol. Home care instructions are as follows:  Perform gentle daily stretching exercises Follow up with: A primary care provider in 1 week for reevaluation Please seek immediate medical care if you develop any of the following symptoms: You cannot move the joint. Your fingers or toes tingle, become numb, or turn cold and blue. You have a fever along with a joint that is red, warm, and swollen. At this time there does not appear to be the presence of an emergent medical condition, however there is always the potential for conditions to change. Please read and follow the below instructions.  Do not take your medicine if  develop an itchy rash, swelling in your mouth or lips, or difficulty breathing; call 911 and seek immediate emergency medical attention if this occurs.  You may review your lab tests and imaging results in their entirety on your MyChart account.  Please discuss all results of fully with your primary care provider and other specialist at your follow-up visit.  Note: Portions of this text may have been transcribed using voice recognition software. Every effort was made to ensure accuracy; however, inadvertent computerized transcription errors may still be present.

## 2022-07-15 ENCOUNTER — Emergency Department (HOSPITAL_COMMUNITY): Payer: Medicaid Other

## 2022-07-15 ENCOUNTER — Emergency Department (HOSPITAL_COMMUNITY)
Admission: EM | Admit: 2022-07-15 | Discharge: 2022-07-15 | Disposition: A | Discharge status: Home / Self Care | Payer: Medicaid Other | Attending: Emergency Medicine | Admitting: Emergency Medicine

## 2022-07-15 ENCOUNTER — Other Ambulatory Visit: Payer: Self-pay

## 2022-07-15 ENCOUNTER — Encounter (HOSPITAL_COMMUNITY): Payer: Self-pay

## 2022-07-15 DIAGNOSIS — R1031 Right lower quadrant pain: Secondary | ICD-10-CM | POA: Diagnosis present

## 2022-07-15 DIAGNOSIS — K5900 Constipation, unspecified: Secondary | ICD-10-CM | POA: Diagnosis not present

## 2022-07-15 LAB — URINALYSIS, ROUTINE W REFLEX MICROSCOPIC
Bilirubin Urine: NEGATIVE
Glucose, UA: NEGATIVE mg/dL
Hgb urine dipstick: NEGATIVE
Ketones, ur: NEGATIVE mg/dL
Leukocytes,Ua: NEGATIVE
Nitrite: NEGATIVE
Protein, ur: NEGATIVE mg/dL
Specific Gravity, Urine: 1.018 (ref 1.005–1.030)
pH: 5 (ref 5.0–8.0)

## 2022-07-15 LAB — CBC WITH DIFFERENTIAL/PLATELET
Abs Immature Granulocytes: 0.02 10*3/uL (ref 0.00–0.07)
Basophils Absolute: 0 10*3/uL (ref 0.0–0.1)
Basophils Relative: 0 %
Eosinophils Absolute: 0.2 10*3/uL (ref 0.0–0.5)
Eosinophils Relative: 3 %
HCT: 45 % (ref 39.0–52.0)
Hemoglobin: 14.5 g/dL (ref 13.0–17.0)
Immature Granulocytes: 0 %
Lymphocytes Relative: 37 %
Lymphs Abs: 2.6 10*3/uL (ref 0.7–4.0)
MCH: 27.4 pg (ref 26.0–34.0)
MCHC: 32.2 g/dL (ref 30.0–36.0)
MCV: 85.1 fL (ref 80.0–100.0)
Monocytes Absolute: 0.7 10*3/uL (ref 0.1–1.0)
Monocytes Relative: 9 %
Neutro Abs: 3.5 10*3/uL (ref 1.7–7.7)
Neutrophils Relative %: 51 %
Platelets: 321 10*3/uL (ref 150–400)
RBC: 5.29 MIL/uL (ref 4.22–5.81)
RDW: 12.9 % (ref 11.5–15.5)
WBC: 7 10*3/uL (ref 4.0–10.5)
nRBC: 0 % (ref 0.0–0.2)

## 2022-07-15 LAB — COMPREHENSIVE METABOLIC PANEL
ALT: 20 U/L (ref 0–44)
AST: 36 U/L (ref 15–41)
Albumin: 4.4 g/dL (ref 3.5–5.0)
Alkaline Phosphatase: 56 U/L (ref 38–126)
Anion gap: 9 (ref 5–15)
BUN: 14 mg/dL (ref 6–20)
CO2: 28 mmol/L (ref 22–32)
Calcium: 9.4 mg/dL (ref 8.9–10.3)
Chloride: 100 mmol/L (ref 98–111)
Creatinine, Ser: 1.2 mg/dL (ref 0.61–1.24)
GFR, Estimated: >60 mL/min (ref >60)
Glucose, Bld: 97 mg/dL (ref 70–99)
Potassium: 3.8 mmol/L (ref 3.5–5.1)
Sodium: 137 mmol/L (ref 135–145)
Total Bilirubin: 0.5 mg/dL (ref 0.3–1.2)
Total Protein: 8.2 g/dL — ABNORMAL HIGH (ref 6.5–8.1)

## 2022-07-15 LAB — LIPASE, BLOOD: Lipase: 32 U/L (ref 11–51)

## 2022-07-15 MED ORDER — POLYETHYLENE GLYCOL 3350 17 GM/SCOOP PO POWD: 1.0000 | Freq: Once | ORAL | 0 refills | Status: AC

## 2022-07-15 NOTE — ED Triage Notes (Signed)
Patient began having right lower quadrant abdominal pain 30 minutes ago after he was bending down to pray. Feels like a cramp. No nausea or vomiting.

## 2022-07-15 NOTE — Discharge Instructions (Addendum)
You were seen in the emergency department for abdominal cramping. Based on labs and imaging, there appears to be a large stool burden in your bowels indicating constipation. I would encouraged increasing water and fiber intake as well as adding in miralax to your diet to promote healthy bowel movements. Please return to the ED if your symptoms are worsening.

## 2022-07-15 NOTE — ED Provider Triage Note (Signed)
Emergency Medicine Provider Triage Evaluation Note  Walter Lloyd , a 22 y.o. male  was evaluated in triage.  Pt complains of abdominal pain.  Reports had a brief episode of right lower quadrant abdominal pain when he bent down earlier today to pray.  He did report that he was at the gym exercising earlier but did not have any pain in his abdomen at that time.  Denies any dysuria hematuria, increased urinary frequency or urgency.  Denies any nausea or vomiting with this.  No history of any abdominal surgeries but did report history of H. pylori.  Not currently on any medications.  Denies constipation or diarrhea recently.  Review of Systems  Positive: As above Negative: As above  Physical Exam  BP (!) 147/86 (BP Location: Left Arm)   Pulse 90   Temp 97.8 F (36.6 C) (Oral)   Resp 16   Ht  (1.905 m)   Wt 95.3 kg   SpO2 100%   BMI 26.25 kg/m  Gen:   Awake, no distress   Resp:  Normal effort  MSK:   Moves extremities without difficulty  Other:  Tenderness to palpation right lower quadrant/periumbilical  Medical Decision Making  Medically screening exam initiated at 6:10 PM.  Appropriate orders placed.  THEOPHIL THIVIERGE was informed that the remainder of the evaluation will be completed by another provider, this initial triage assessment does not replace that evaluation, and the importance of remaining in the ED until their evaluation is complete.     Smitty Knudsen, PA-C 07/15/22 1812

## 2022-07-24 NOTE — ED Provider Notes (Signed)
[place] EMERGENCY DEPARTMENT AT [facility] Provider Note   CSN: [id number] Arrival date & time: 07/15/22  1744     History Chief Complaint  Patient presents with   Abdominal Cramping    Walter Lloyd is a 22 y.o. male.  Patient presents emergency department complaints of right lower quadrant abdominal pain present for about 30 minutes prior to arriving in the emergency department.  He reports that he was kneeling down to play with this abdominal pain began.  No prior history of any abdominal abnormalities.  Feels that his abdomen is cramping.  Denies any nausea or vomiting.  Denies any urinary symptoms at the time such as dysuria, hematuria, increased urinary frequency or urgency.   Abdominal Cramping Associated symptoms include abdominal pain.       Home Medications Prior to Admission medications   Medication Sig Start Date End Date Taking? Authorizing Provider  ibuprofen (ADVIL) 800 MG tablet Take 1 tablet (800 mg total) by mouth 3 (three) times daily. 06/19/22   Schutt, Edsel Petrin, PA-C      Allergies    Patient has no known allergies.    Review of Systems   Review of Systems  Gastrointestinal:  Positive for abdominal pain.  All other systems reviewed and are negative.   Physical Exam Updated Vital Signs BP (!) 142/80 (BP Location: Right Arm)   Pulse 88   Temp 98.2 F (36.8 C) (Oral)   Resp 16   Ht 6\' 3"  (1.905 m)   Wt 95.3 kg   SpO2 99%   BMI 26.25 kg/m  Physical Exam Vitals and nursing note reviewed.  Constitutional:      General: He is not in acute distress.    Appearance: He is well-developed.  HENT:     Head: Normocephalic and atraumatic.  Eyes:     Conjunctiva/sclera: Conjunctivae normal.  Cardiovascular:     Rate and Rhythm: Normal rate and regular rhythm.     Heart sounds: No murmur heard. Pulmonary:     Effort: Pulmonary effort is normal. No respiratory distress.     Breath sounds: Normal breath sounds.  Abdominal:      Palpations: Abdomen is soft.     Tenderness: There is abdominal tenderness.  Musculoskeletal:        General: No swelling.     Cervical back: Neck supple.  Skin:    General: Skin is warm and dry.     Capillary Refill: Capillary refill takes less than 2 seconds.  Neurological:     Mental Status: He is alert.  Psychiatric:        Mood and Affect: Mood normal.     ED Results / Procedures / Treatments   Labs (all labs ordered are listed, but only abnormal results are displayed) Labs Reviewed  COMPREHENSIVE METABOLIC PANEL - Abnormal; Notable for the following components:      Result Value   Total Protein 8.2 (*)    All other components within normal limits  CBC WITH DIFFERENTIAL/PLATELET  LIPASE, BLOOD  URINALYSIS, ROUTINE W REFLEX MICROSCOPIC    EKG None  Radiology No results found.  Procedures Procedures   Medications Ordered in ED Medications - No data to display  ED Course/ Medical Decision Making/ A&P Clinical Course as of 07/24/22 0047  Mon Jul 15, 2022  2121 CT ABDOMEN PELVIS WO CONTRAST Large stool burden. No other specific pathology to explain pain [OZ]    Clinical Course User Index [OZ] Smitty Knudsen, PA-C  Medical Decision Making Amount and/or Complexity of Data Reviewed Labs: ordered. Radiology: ordered. Decision-making details documented in ED Course.   This patient presents to the ED for concern of abdominal pain.  Differential diagnosis includes appendicitis, cholecystitis, diverticulitis, bowel obstruction, constipation   Lab Tests:  I Ordered, and personally interpreted labs.  The pertinent results include: CBC, CMP, and lipase unremarkable, urine negative for infection   Imaging Studies ordered:  I ordered imaging studies including CT abdomen pelvis I independently visualized and interpreted imaging which showed large stool burden but no other acute abnormalities I agree with the radiologist  interpretation  Problem List / ED Course:  Patient presents to the emergency department complaints of abdominal cramping.  He reports that this Began when he was kneeling down to pray.  Denies any prior history of abdominal abnormalities.  No recent abdominal surgeries.  Based on patient's presentation, lab workup initiated which was thankfully reassuring without any acute abnormalities noted on CBC, CMP, lipase, urine.  CT abdomen pelvis also ordered which thankfully is reassuring as there is evidence of a large stool burden but no signs of any bowel obstruction, appendicitis, cholecystitis.  Advised patient he likely needs to be on an osmotic laxative such as MiraLAX and fiber to increase the quality of his bowel movements to reduce the discomfort has been feeling in his abdomen.  Prescription for MiraLAX and the patient's pharmacy with instructions to take this medication to reduce the abdominal discomfort has been feeling.  Also have provided patient with information for [facility] to establish care with a primary care provider.  Patient is agreeable with this treatment plan and verbalized understanding all return precautions.  All questions answered prior to patient discharge.  Final Clinical Impression(s) / ED Diagnoses Final diagnoses:  Constipation, unspecified constipation type    Rx / DC Orders ED Discharge Orders          Ordered    polyethylene glycol powder (GLYCOLAX/MIRALAX) 17 GM/SCOOP powder   Once        07/15/22 2158              Smitty Knudsen, PA-C 07/24/22 0047    Sloan Leiter, DO 07/25/22 0117

## 2022-08-31 ENCOUNTER — Emergency Department (HOSPITAL_COMMUNITY)
Admission: EM | Admit: 2022-08-31 | Discharge: 2022-08-31 | Disposition: A | Discharge status: Home / Self Care | Payer: Medicaid Other | Attending: Emergency Medicine | Admitting: Emergency Medicine

## 2022-08-31 ENCOUNTER — Other Ambulatory Visit: Payer: Self-pay

## 2022-08-31 DIAGNOSIS — M25462 Effusion, left knee: Secondary | ICD-10-CM | POA: Insufficient documentation

## 2022-08-31 DIAGNOSIS — M25469 Effusion, unspecified knee: Secondary | ICD-10-CM

## 2022-08-31 NOTE — ED Provider Notes (Signed)
[place] EMERGENCY DEPARTMENT AT [facility] Provider Note   CSN: [id number] Arrival date & time: 08/31/22  4098     History  Chief Complaint  Patient presents with   knee stiffness    Walter Lloyd is a 22 y.o. male.  Patient with history of right meniscus tear 2 years ago that was not repaired surgically presents today with complaints of left knee swelling. He states that he did a leg workout on Wednesday and woke up Thursday morning with swelling. He states that his knee is always swollen since he tore his meniscus but today is more than usual. He denies any pain. Does state that he feels like he cannot range his knee as well as normal due to it feeling stiff. He is able to walk without issue. Denies fevers or chills. States that this has happened before and he has not been seen for it.   The history is provided by the patient. No language interpreter was used.       Home Medications Prior to Admission medications   Medication Sig Start Date End Date Taking? Authorizing Provider  ibuprofen (ADVIL) 800 MG tablet Take 1 tablet (800 mg total) by mouth 3 (three) times daily. 06/19/22   Schutt, Edsel Petrin, PA-C      Allergies    Patient has no known allergies.    Review of Systems   Review of Systems  Musculoskeletal:  Positive for joint swelling.  All other systems reviewed and are negative.   Physical Exam Updated Vital Signs BP 101/83 (BP Location: Right Arm)   Pulse 65   Temp 98.5 F (36.9 C) (Oral)   Resp 16   Ht 6\' 3"  (1.905 m)   Wt 97.1 kg   SpO2 100%   BMI 26.75 kg/m  Physical Exam Vitals and nursing note reviewed.  Constitutional:      General: He is not in acute distress.    Appearance: Normal appearance. He is normal weight. He is not ill-appearing, toxic-appearing or diaphoretic.  HENT:     Head: Normocephalic and atraumatic.  Cardiovascular:     Rate and Rhythm: Normal rate.  Pulmonary:     Effort: Pulmonary effort is normal. No  respiratory distress.  Musculoskeletal:        General: Normal range of motion.     Cervical back: Normal range of motion.     Comments: Mild swelling noted to the right knee without erythema, warmth, fluctuance, or induration. No laxity or pain with ROM. No appreciable stiffness. Patient ambulatory with steady gait. DP and PT pulses intact and 2+.   Skin:    General: Skin is warm and dry.  Neurological:     General: No focal deficit present.     Mental Status: He is alert.  Psychiatric:        Mood and Affect: Mood normal.        Behavior: Behavior normal.     ED Results / Procedures / Treatments   Labs (all labs ordered are listed, but only abnormal results are displayed) Labs Reviewed - No data to display  EKG None  Radiology No results found.  Procedures Procedures    Medications Ordered in ED Medications - No data to display  ED Course/ Medical Decision Making/ A&P                             Medical Decision Making  Patient presents today  with complaints of right knee swelling after exercise.  He is afebrile, nontoxic-appearing, and in no acute distress with reassuring vital signs. Physical exam per above reveals mild swelling without erythema, warmth, fluctuance, or induration.  Patient has good ROM and no pain.  He walks with a steady gait.  There is no deformity.  Compartments are soft.  Distal pulses intact.  No signs of acute injury or septic arthritis. No indication for imaging at this time. No indication for joint aspiration. Suspect patient is having symptoms from his chronic injury. Discussed with patient who is understanding and in agreement. Offered a knee brace which he declined. Recommend RICE and tylenol/ibuprofen for pain. Given ortho referral for follow-up. Evaluation and diagnostic testing in the emergency department does not suggest an emergent condition requiring admission or immediate intervention beyond what has been performed at this time.  Plan for  discharge with close PCP follow-up.  Patient is understanding and amenable with plan, educated on red flag symptoms that would prompt immediate return.  Patient discharged in stable condition.   Final Clinical Impression(s) / ED Diagnoses Final diagnoses:  Swelling of knee    Rx / DC Orders ED Discharge Orders     None     An After Visit Summary was printed and given to the patient.     Vear Clock 08/31/22 1054    Rolan Bucco, MD 08/31/22 1530

## 2022-08-31 NOTE — ED Triage Notes (Signed)
Pt c/o R knee stiffness that began yesterday- no pain. Previous meniscus tear

## 2022-08-31 NOTE — Discharge Instructions (Addendum)
As we discussed, I suspect that the symptoms you are experiencing are due to your chronic injury.  I recommend that you rest, ice, compress, and elevate your knee and take Tylenol/ibuprofen as needed.  I given you referral to orthopedics to schedule a follow-up appointment.  Please call at your earliest convenience.  Return if development of any new or worsening symptoms.
